# Patient Record
Sex: Female | Born: 1980 | Race: Black or African American | Hispanic: No | Marital: Married | Smoking: Never smoker
Health system: Southern US, Community
[De-identification: ages and names within clinical notes are randomized; demographics above are authoritative.]

## PROBLEM LIST (undated history)

## (undated) DIAGNOSIS — K219 Gastro-esophageal reflux disease without esophagitis: Secondary | ICD-10-CM

## (undated) HISTORY — PX: WISDOM TOOTH EXTRACTION: SHX21

---

## 2013-08-26 LAB — OB RESULTS CONSOLE HIV ANTIBODY (ROUTINE TESTING): HIV: NONREACTIVE

## 2013-08-26 LAB — OB RESULTS CONSOLE HEPATITIS B SURFACE ANTIGEN: Hepatitis B Surface Ag: NEGATIVE

## 2013-10-17 LAB — OB RESULTS CONSOLE GC/CHLAMYDIA
Chlamydia: NEGATIVE
GC PROBE AMP, GENITAL: NEGATIVE

## 2013-10-17 LAB — OB RESULTS CONSOLE RUBELLA ANTIBODY, IGM: Rubella: IMMUNE

## 2014-02-01 ENCOUNTER — Emergency Department (HOSPITAL_COMMUNITY)
Admission: EM | Admit: 2014-02-01 | Discharge: 2014-02-01 | Disposition: A | Discharge status: Home / Self Care | Payer: PRIVATE HEALTH INSURANCE | Attending: Emergency Medicine | Admitting: Emergency Medicine

## 2014-02-01 ENCOUNTER — Encounter (HOSPITAL_COMMUNITY): Payer: Self-pay

## 2014-02-01 ENCOUNTER — Emergency Department (HOSPITAL_COMMUNITY): Payer: PRIVATE HEALTH INSURANCE

## 2014-02-01 DIAGNOSIS — O9989 Other specified diseases and conditions complicating pregnancy, childbirth and the puerperium: Secondary | ICD-10-CM | POA: Insufficient documentation

## 2014-02-01 DIAGNOSIS — Z79899 Other long term (current) drug therapy: Secondary | ICD-10-CM | POA: Diagnosis not present

## 2014-02-01 DIAGNOSIS — R0602 Shortness of breath: Secondary | ICD-10-CM

## 2014-02-01 DIAGNOSIS — K219 Gastro-esophageal reflux disease without esophagitis: Secondary | ICD-10-CM | POA: Insufficient documentation

## 2014-02-01 DIAGNOSIS — Z3A29 29 weeks gestation of pregnancy: Secondary | ICD-10-CM | POA: Insufficient documentation

## 2014-02-01 DIAGNOSIS — R42 Dizziness and giddiness: Secondary | ICD-10-CM | POA: Insufficient documentation

## 2014-02-01 DIAGNOSIS — Z8719 Personal history of other diseases of the digestive system: Secondary | ICD-10-CM | POA: Insufficient documentation

## 2014-02-01 HISTORY — DX: Gastro-esophageal reflux disease without esophagitis: K21.9

## 2014-02-01 LAB — CBC WITH DIFFERENTIAL/PLATELET
Basophils Absolute: 0 10*3/uL (ref 0.0–0.1)
Basophils Relative: 0 % (ref 0–1)
EOS PCT: 1 % (ref 0–5)
Eosinophils Absolute: 0.1 10*3/uL (ref 0.0–0.7)
HCT: 31.4 % — ABNORMAL LOW (ref 36.0–46.0)
Hemoglobin: 11 g/dL — ABNORMAL LOW (ref 12.0–15.0)
LYMPHS PCT: 12 % (ref 12–46)
Lymphs Abs: 1.6 10*3/uL (ref 0.7–4.0)
MCH: 30.4 pg (ref 26.0–34.0)
MCHC: 35 g/dL (ref 30.0–36.0)
MCV: 86.7 fL (ref 78.0–100.0)
MONOS PCT: 6 % (ref 3–12)
Monocytes Absolute: 0.8 10*3/uL (ref 0.1–1.0)
Neutro Abs: 10.2 10*3/uL — ABNORMAL HIGH (ref 1.7–7.7)
Neutrophils Relative %: 81 % — ABNORMAL HIGH (ref 43–77)
Platelets: 234 10*3/uL (ref 150–400)
RBC: 3.62 MIL/uL — ABNORMAL LOW (ref 3.87–5.11)
RDW: 13 % (ref 11.5–15.5)
WBC: 12.6 10*3/uL — AB (ref 4.0–10.5)

## 2014-02-01 LAB — TROPONIN I

## 2014-02-01 LAB — COMPREHENSIVE METABOLIC PANEL
ALT: 14 U/L (ref 0–35)
AST: 18 U/L (ref 0–37)
Albumin: 2.8 g/dL — ABNORMAL LOW (ref 3.5–5.2)
Alkaline Phosphatase: 68 U/L (ref 39–117)
Anion gap: 13 (ref 5–15)
BUN: 13 mg/dL (ref 6–23)
CALCIUM: 9.2 mg/dL (ref 8.4–10.5)
CO2: 20 mEq/L (ref 19–32)
Chloride: 104 mEq/L (ref 96–112)
Creatinine, Ser: 0.68 mg/dL (ref 0.50–1.10)
GFR calc non Af Amer: >90 mL/min (ref >90)
Glucose, Bld: 100 mg/dL — ABNORMAL HIGH (ref 70–99)
Potassium: 3.7 mEq/L (ref 3.7–5.3)
Sodium: 137 mEq/L (ref 137–147)
Total Bilirubin: 0.2 mg/dL — ABNORMAL LOW (ref 0.3–1.2)
Total Protein: 6.7 g/dL (ref 6.0–8.3)

## 2014-02-01 LAB — PRO B NATRIURETIC PEPTIDE: Pro B Natriuretic peptide (BNP): 55.1 pg/mL (ref 0–125)

## 2014-02-01 NOTE — ED Provider Notes (Signed)
CSN: [id number]     Arrival date & time 02/01/14  1429 History   First MD Initiated Contact with Patient 02/01/14 1459     Chief Complaint  Patient presents with  . Shortness of Breath     (Consider location/radiation/quality/duration/timing/severity/associated sxs/prior Treatment) Patient is a 33 y.o. female presenting with shortness of breath.  Shortness of Breath Severity:  Severe Onset quality:  Gradual Duration:  1 week Timing:  Constant Progression:  Worsening (significantly worse today) Chronicity:  New Context comment:  [redacted] weeks pregnant.  G1P0 Relieved by:  Rest Worsened by:  Exertion and activity Associated symptoms: no chest pain, no cough, no fever and no syncope   Associated symptoms comment:  Lightheadedness   Past Medical History  Diagnosis Date  . Acid reflux    No past surgical history on file. No family history on file. History  Substance Use Topics  . Smoking status: Never Smoker   . Smokeless tobacco: Not on file  . Alcohol Use: No   OB History    Gravida Para Term Preterm AB TAB SAB Ectopic Multiple Living   1              Review of Systems  Constitutional: Negative for fever.  Respiratory: Positive for shortness of breath. Negative for cough.   Cardiovascular: Negative for chest pain and syncope.  All other systems reviewed and are negative.     Allergies  Prilosec  Home Medications   Prior to Admission medications   Medication Sig Start Date End Date Taking? Authorizing Provider  acetaminophen (TYLENOL) 500 MG tablet Take 500 mg by mouth every 6 (six) hours as needed for moderate pain or headache.   Yes Historical Provider, MD  Prenatal Vit-Fe Fumarate-FA (PRENATAL PO) Take 1 tablet by mouth daily.   Yes Historical Provider, MD   BP 103/66 mmHg  Pulse 85  Temp(Src) 98.2 F (36.8 C) (Oral)  Resp 16  SpO2 100%  LMP 07/08/2013 Physical Exam  Constitutional: She is oriented to person, place, and time. She appears well-developed  and well-nourished. No distress.  HENT:  Head: Normocephalic and atraumatic.  Mouth/Throat: Oropharynx is clear and moist.  Eyes: Conjunctivae are normal. Pupils are equal, round, and reactive to light. No scleral icterus.  Neck: Neck supple.  Cardiovascular: Normal rate, regular rhythm, normal heart sounds and intact distal pulses.   No murmur heard. Pulmonary/Chest: Effort normal and breath sounds normal. No stridor. No respiratory distress. She has no wheezes. She has no rales.  Abdominal: Soft. Bowel sounds are normal. She exhibits no distension. There is no tenderness. There is no rebound and no guarding.  gravid  Musculoskeletal: Normal range of motion.  Neurological: She is alert and oriented to person, place, and time.  Skin: Skin is warm and dry. No rash noted.  Psychiatric: She has a normal mood and affect. Her behavior is normal.  Nursing note and vitals reviewed.   ED Course  Procedures (including critical care time) Labs Review Labs Reviewed  CBC WITH DIFFERENTIAL - Abnormal; Notable for the following:    WBC 12.6 (*)    RBC 3.62 (*)    Hemoglobin 11.0 (*)    HCT 31.4 (*)    Neutrophils Relative % 81 (*)    Neutro Abs 10.2 (*)    All other components within normal limits  COMPREHENSIVE METABOLIC PANEL - Abnormal; Notable for the following:    Glucose, Bld 100 (*)    Albumin 2.8 (*)    Total Bilirubin  0.2 (*)    All other components within normal limits  TROPONIN I  PRO B NATRIURETIC PEPTIDE    Imaging Review Dg Chest 2 View  02/01/2014   CLINICAL DATA:  33 year old female with shortness of breath.  EXAM: CHEST  2 VIEW  COMPARISON:  None.  FINDINGS: The cardiomediastinal silhouette is unremarkable.  Mild peribronchial thickening identified.  There is no evidence of focal airspace disease, pulmonary edema, suspicious pulmonary nodule/mass, pleural effusion, or pneumothorax. No acute bony abnormalities are identified.  IMPRESSION: Mild peribronchial thickening of  uncertain chronicity, which may be a manifestation of bronchitis or reactive/asthma changes. No other significant abnormalities noted.   Electronically Signed   By: Laveda AbbeJeff  Hu M.D.   On: 02/01/2014 16:29  All radiology studies independently viewed by me.      EKG Interpretation   Date/Time:  Sunday February 01 2014 15:34:05 EST Ventricular Rate:  87 PR Interval:  126 QRS Duration: 73 QT Interval:  351 QTC Calculation: 422 R Axis:   69 Text Interpretation:  Sinus rhythm Borderline T abnormalities, anterior  leads No old tracing to compare Confirmed by [facility]OFF[facility]  MD, TREY ([id number]) on  02/01/2014 4:14:51 PM      MDM   Final diagnoses:  Shortness of breath    33 yo G1P0 presenting with shortness of breath.  She has been feeling some SOB for past week, but felt that it was pregnancy related.  Today, however, she has had two episodes when she became much more short of breath than expected with minimal exertion.  The second episode was after walking to her car.  She then drove for about 2 minutes before having to pull over secondary to SOB and dizziness.  Pregnancy has thus far been uncomplicated.    Labs unremarkable.  CXR normal.  She never became hypoxic.  She ambulated initially and was found to be tachycardic.  I then personally walked her briskly around the ED and immediately afterwards her HR was 90, her O2 sats were 100%, and she did not have any increased WOB.  I had a long discussion with her regarding the risks and benefits for CT imaging to rule out PE.  Her history was concerning for this, but her workup and exam were very reassuring.  Ultimately, she understood the risks of undiagnosed PE including morbidity and mortality.  She preferred to not obtain the CT scan.  She agreed to return if her SOB returned or if new concerning symptoms developed.  She will follow up closely with her OB office.  I spoke with her obstetrician on call, Dr. Chestine Sporelark, to review her history and ensure close follow  up.    Warnell Foresterrey Rollie Hynek, MD 02/01/14 2322

## 2014-02-01 NOTE — Discharge Instructions (Signed)

## 2014-02-01 NOTE — ED Notes (Signed)
Patient declined wheelchair

## 2014-02-01 NOTE — ED Notes (Signed)
She c/o shortness of breath x 2 days.  She states she is currently 27 wk. Gestation.  She is in no distress.  She states her shob became very pronounced while driving this afternoon "and it scared me".

## 2014-02-01 NOTE — ED Notes (Signed)
Patient transported to X-ray 

## 2014-03-10 LAB — OB RESULTS CONSOLE GBS: GBS: NEGATIVE

## 2014-04-13 ENCOUNTER — Inpatient Hospital Stay (HOSPITAL_COMMUNITY): Payer: PRIVATE HEALTH INSURANCE | Admitting: Anesthesiology

## 2014-04-13 ENCOUNTER — Encounter (HOSPITAL_COMMUNITY): Payer: Self-pay | Admitting: *Deleted

## 2014-04-13 ENCOUNTER — Inpatient Hospital Stay (HOSPITAL_COMMUNITY)
Admission: AD | Admit: 2014-04-13 | Discharge: 2014-04-16 | DRG: 766 | Disposition: A | Discharge status: Home / Self Care | Payer: PRIVATE HEALTH INSURANCE | Source: Ambulatory Visit | Attending: Obstetrics and Gynecology | Admitting: Obstetrics and Gynecology

## 2014-04-13 DIAGNOSIS — Z9889 Other specified postprocedural states: Secondary | ICD-10-CM

## 2014-04-13 DIAGNOSIS — Z3A39 39 weeks gestation of pregnancy: Secondary | ICD-10-CM | POA: Diagnosis present

## 2014-04-13 DIAGNOSIS — I959 Hypotension, unspecified: Secondary | ICD-10-CM | POA: Diagnosis present

## 2014-04-13 LAB — CBC
HCT: 36.8 % (ref 36.0–46.0)
Hemoglobin: 12.6 g/dL (ref 12.0–15.0)
MCH: 29.2 pg (ref 26.0–34.0)
MCHC: 34.2 g/dL (ref 30.0–36.0)
MCV: 85.4 fL (ref 78.0–100.0)
PLATELETS: 255 10*3/uL (ref 150–400)
RBC: 4.31 MIL/uL (ref 3.87–5.11)
RDW: 14.4 % (ref 11.5–15.5)
WBC: 19.1 10*3/uL — ABNORMAL HIGH (ref 4.0–10.5)

## 2014-04-13 LAB — TYPE AND SCREEN
ABO/RH(D): A POS
ANTIBODY SCREEN: NEGATIVE

## 2014-04-13 LAB — POCT FERN TEST: POCT Fern Test: POSITIVE

## 2014-04-13 LAB — OB RESULTS CONSOLE ABO/RH: RH Type: POSITIVE

## 2014-04-13 MED ORDER — OXYTOCIN BOLUS FROM INFUSION: 500.0000 mL | INTRAVENOUS | Status: DC

## 2014-04-13 MED ORDER — OXYTOCIN 40 UNITS IN LACTATED RINGERS INFUSION - SIMPLE MED
62.5000 mL/h | INTRAVENOUS | Status: DC
  Filled 2014-04-13: qty 1000

## 2014-04-13 MED ORDER — LIDOCAINE HCL (PF) 1 % IJ SOLN
30.0000 mL | INTRAMUSCULAR | Status: DC | PRN
  Filled 2014-04-13: qty 30

## 2014-04-13 MED ORDER — FENTANYL 2.5 MCG/ML BUPIVACAINE 1/10 % EPIDURAL INFUSION (WH - ANES)
14.0000 mL/h | INTRAMUSCULAR | Status: DC | PRN
  Administered 2014-04-13 – 2014-04-14 (×2): 14 mL/h via EPIDURAL
  Filled 2014-04-13 (×2): qty 125

## 2014-04-13 MED ORDER — PHENYLEPHRINE 40 MCG/ML (10ML) SYRINGE FOR IV PUSH (FOR BLOOD PRESSURE SUPPORT)
80.0000 ug | PREFILLED_SYRINGE | INTRAVENOUS | Status: DC | PRN
  Administered 2014-04-13: 80 ug via INTRAVENOUS
  Filled 2014-04-13: qty 20

## 2014-04-13 MED ORDER — LACTATED RINGERS IV SOLN
INTRAVENOUS | Status: DC
  Administered 2014-04-13 – 2014-04-14 (×3): via INTRAVENOUS

## 2014-04-13 MED ORDER — ONDANSETRON HCL 4 MG/2ML IJ SOLN
4.0000 mg | Freq: Four times a day (QID) | INTRAMUSCULAR | Status: DC | PRN
  Administered 2014-04-14: 4 mg via INTRAVENOUS
  Filled 2014-04-13: qty 2

## 2014-04-13 MED ORDER — FENTANYL 2.5 MCG/ML BUPIVACAINE 1/10 % EPIDURAL INFUSION (WH - ANES)
INTRAMUSCULAR | Status: DC | PRN
  Administered 2014-04-13: 14 mL/h via EPIDURAL

## 2014-04-13 MED ORDER — ACETAMINOPHEN 325 MG PO TABS: 650.0000 mg | ORAL_TABLET | ORAL | Status: DC | PRN

## 2014-04-13 MED ORDER — DIPHENHYDRAMINE HCL 50 MG/ML IJ SOLN: 12.5000 mg | INTRAMUSCULAR | Status: DC | PRN

## 2014-04-13 MED ORDER — CITRIC ACID-SODIUM CITRATE 334-500 MG/5ML PO SOLN
30.0000 mL | ORAL | Status: DC | PRN
  Administered 2014-04-14: 30 mL via ORAL
  Filled 2014-04-13: qty 15

## 2014-04-13 MED ORDER — LACTATED RINGERS IV SOLN
500.0000 mL | INTRAVENOUS | Status: DC | PRN
  Administered 2014-04-13 – 2014-04-14 (×3): 500 mL via INTRAVENOUS

## 2014-04-13 MED ORDER — LACTATED RINGERS IV SOLN
500.0000 mL | Freq: Once | INTRAVENOUS | Status: AC
  Administered 2014-04-13: 500 mL via INTRAVENOUS

## 2014-04-13 MED ORDER — BUTORPHANOL TARTRATE 1 MG/ML IJ SOLN
1.0000 mg | INTRAMUSCULAR | Status: DC | PRN
  Administered 2014-04-13 (×2): 1 mg via INTRAVENOUS
  Filled 2014-04-13 (×2): qty 1

## 2014-04-13 MED ORDER — EPHEDRINE 5 MG/ML INJ
10.0000 mg | INTRAVENOUS | Status: DC | PRN
  Administered 2014-04-14: 10 mg via INTRAVENOUS

## 2014-04-13 MED ORDER — EPHEDRINE 5 MG/ML INJ
10.0000 mg | INTRAVENOUS | Status: DC | PRN
  Filled 2014-04-13: qty 4

## 2014-04-13 MED ORDER — LIDOCAINE HCL (PF) 1 % IJ SOLN
INTRAMUSCULAR | Status: DC | PRN
  Administered 2014-04-13 (×2): 4 mL

## 2014-04-13 MED ORDER — PHENYLEPHRINE 40 MCG/ML (10ML) SYRINGE FOR IV PUSH (FOR BLOOD PRESSURE SUPPORT)
80.0000 ug | PREFILLED_SYRINGE | INTRAVENOUS | Status: DC | PRN
  Administered 2014-04-13: 80 ug via INTRAVENOUS

## 2014-04-13 NOTE — MAU Note (Signed)
Lost mucous plug on Sat, started having mild cramping.  Became more intense today, Since 1600 have been about 2-4 minutes apart. Was closed at last appt.  No bleeding or leaking.

## 2014-04-13 NOTE — Anesthesia Preprocedure Evaluation (Signed)
Anesthesia Evaluation  Patient identified by MRN, date of birth, ID band Patient awake    Reviewed: Allergy & Precautions, NPO status , Patient's Chart, lab work & pertinent test results  Airway Mallampati: II  TM Distance: >3 FB Neck ROM: Full    Dental no notable dental hx. (+) Teeth Intact   Pulmonary neg pulmonary ROS,  breath sounds clear to auscultation  Pulmonary exam normal       Cardiovascular negative cardio ROS  Rhythm:Regular Rate:Normal     Neuro/Psych negative neurological ROS  negative psych ROS   GI/Hepatic Neg liver ROS, GERD-  Medicated and Controlled,  Endo/Other  negative endocrine ROS  Renal/GU negative Renal ROS  negative genitourinary   Musculoskeletal negative musculoskeletal ROS (+)   Abdominal (+) - obese,   Peds  Hematology negative hematology ROS (+)   Anesthesia Other Findings   Reproductive/Obstetrics (+) Pregnancy                             Anesthesia Physical Anesthesia Plan  ASA: II  Anesthesia Plan: Epidural   Post-op Pain Management:    Induction:   Airway Management Planned: Natural Airway  Additional Equipment:   Intra-op Plan:   Post-operative Plan:   Informed Consent: I have reviewed the patients History and Physical, chart, labs and discussed the procedure including the risks, benefits and alternatives for the proposed anesthesia with the patient or authorized representative who has indicated his/her understanding and acceptance.     Plan Discussed with: Anesthesiologist  Anesthesia Plan Comments:         Anesthesia Quick Evaluation

## 2014-04-13 NOTE — Anesthesia Procedure Notes (Addendum)
Epidural Patient location during procedure: OB Start time: 04/13/2014 11:40 PM  Staffing Anesthesiologist: [place] Kirchner A. Performed by: anesthesiologist   Preanesthetic Checklist Completed: patient identified, site marked, surgical consent, pre-op evaluation, timeout performed, IV checked, risks and benefits discussed and monitors and equipment checked  Epidural Patient position: sitting Prep: site prepped and draped and DuraPrep Patient monitoring: continuous pulse ox and blood pressure Approach: midline Location: L3-L4 Injection technique: LOR air  Needle:  Needle type: Tuohy  Needle gauge: 17 G Needle length: 9 cm and 9 Needle insertion depth: 4 cm Catheter type: closed end flexible Catheter size: 19 Gauge Catheter at skin depth: 9 cm Test dose: negative and Other  Assessment Events: blood not aspirated, injection not painful, no injection resistance (.mf), negative IV test and no paresthesia  Additional Notes Patient identified. Risks and benefits discussed including failed block, incomplete  Pain control, post dural puncture headache, nerve damage, paralysis, blood pressure Changes, nausea, vomiting, reactions to medications-both toxic and allergic and post Partum back pain. All questions were answered. Patient expressed understanding and wished to proceed. Sterile technique was used throughout procedure. Epidural site was Dressed with sterile barrier dressing. No paresthesias, signs of intravascular injection Or signs of intrathecal spread were encountered.  Patient was more comfortable after the epidural was dosed. Please see RN's note for documentation of vital signs and FHR which are stable.

## 2014-04-13 NOTE — H&P (Signed)
34 y.o. G2P0010 @ 2068w6d presents with contractions q2-4 min since 1600.  She was found to be 1/80. While in MAU, she had rupture of membranes with a large gush of fluid, +nitrazine and + fern.  Otherwise has good fetal movement and no bleeding.  Past Medical History  Diagnosis Date  . Acid reflux     Past Surgical History  Procedure Laterality Date  . Wisdom tooth extraction      OB History  Gravida Para Term Preterm AB SAB TAB Ectopic Multiple Living  2    1 1         # Outcome Date GA Lbr Len/2nd Weight Sex Delivery Anes PTL Lv  2 Current           1 SAB               History   Social History  . Marital Status: Married    Spouse Name: N/A    Number of Children: N/A  . Years of Education: N/A   Occupational History  . Not on file.   Social History Main Topics  . Smoking status: Never Smoker   . Smokeless tobacco: Not on file  . Alcohol Use: No  . Drug Use: No  . Sexual Activity: Yes   Other Topics Concern  . Not on file   Social History Narrative   Prilosec    Prenatal Transfer Tool  Maternal Diabetes: No Genetic Screening: Normal FTS Maternal Ultrasounds/Referrals: Normal Fetal Ultrasounds or other Referrals:  None Maternal Substance Abuse:  No Significant Maternal Medications:  None Significant Maternal Lab Results: Lab values include: Group B Strep negative   Other PNC: uncomplicated.    Filed Vitals:   04/13/14 2019  BP: 117/74  Pulse: 75  Temp: 98.3 F (36.8 C)  Resp: 18     General:  NAD Lungs: CTAB Cardiac: RRR Abdomen:  soft, gravid, EFW 7# Ex:  tr edema SVE:  2-3/90/-1  FHTs:  150s, mod var, Cat 1 Toco:  q1-2 min   A/P   34 y.o. 2868w6d  G2P0010 presents with ROM IV pain meds/Epidural upon maternal request FSR/ vtx/ GBS neg  Kara Hays, Kara Hays

## 2014-04-13 NOTE — MAU Note (Signed)
Pt preparing to go walking, had large gush of clear fluid in the floor, fern positive.

## 2014-04-14 ENCOUNTER — Encounter (HOSPITAL_COMMUNITY)
Admission: AD | Disposition: A | Discharge status: Home / Self Care | Payer: Self-pay | Source: Ambulatory Visit | Attending: Obstetrics and Gynecology

## 2014-04-14 ENCOUNTER — Encounter (HOSPITAL_COMMUNITY): Payer: Self-pay | Admitting: *Deleted

## 2014-04-14 DIAGNOSIS — Z9889 Other specified postprocedural states: Secondary | ICD-10-CM

## 2014-04-14 LAB — ABO/RH: ABO/RH(D): A POS

## 2014-04-14 SURGERY — Surgical Case
Anesthesia: Epidural

## 2014-04-14 MED ORDER — METHYLERGONOVINE MALEATE 0.2 MG/ML IJ SOLN: 0.2000 mg | INTRAMUSCULAR | Status: DC | PRN

## 2014-04-14 MED ORDER — TERBUTALINE SULFATE 1 MG/ML IJ SOLN: 0.2500 mg | Freq: Once | INTRAMUSCULAR | Status: DC | PRN

## 2014-04-14 MED ORDER — MEPERIDINE HCL 25 MG/ML IJ SOLN: 6.2500 mg | INTRAMUSCULAR | Status: DC | PRN

## 2014-04-14 MED ORDER — FENTANYL CITRATE 0.05 MG/ML IJ SOLN
25.0000 ug | INTRAMUSCULAR | Status: DC | PRN
  Administered 2014-04-14: 25 ug via INTRAVENOUS

## 2014-04-14 MED ORDER — ACETAMINOPHEN 500 MG PO TABS
1000.0000 mg | ORAL_TABLET | Freq: Four times a day (QID) | ORAL | Status: AC
  Administered 2014-04-14 – 2014-04-15 (×3): 1000 mg via ORAL
  Filled 2014-04-14 (×3): qty 2

## 2014-04-14 MED ORDER — TETANUS-DIPHTH-ACELL PERTUSSIS 5-2.5-18.5 LF-MCG/0.5 IM SUSP: 0.5000 mL | Freq: Once | INTRAMUSCULAR | Status: DC

## 2014-04-14 MED ORDER — MEASLES, MUMPS & RUBELLA VAC ~~LOC~~ INJ
0.5000 mL | INJECTION | Freq: Once | SUBCUTANEOUS | Status: DC
  Filled 2014-04-14: qty 0.5

## 2014-04-14 MED ORDER — KETOROLAC TROMETHAMINE 30 MG/ML IJ SOLN
30.0000 mg | Freq: Four times a day (QID) | INTRAMUSCULAR | Status: AC | PRN
  Administered 2014-04-14: 30 mg via INTRAMUSCULAR

## 2014-04-14 MED ORDER — SCOPOLAMINE 1 MG/3DAYS TD PT72
MEDICATED_PATCH | TRANSDERMAL | Status: AC
  Filled 2014-04-14: qty 1

## 2014-04-14 MED ORDER — MENTHOL 3 MG MT LOZG: 1.0000 | LOZENGE | OROMUCOSAL | Status: DC | PRN

## 2014-04-14 MED ORDER — OXYTOCIN 40 UNITS IN LACTATED RINGERS INFUSION - SIMPLE MED
1.0000 m[IU]/min | INTRAVENOUS | Status: DC
  Administered 2014-04-14: 2 m[IU]/min via INTRAVENOUS
  Filled 2014-04-14: qty 1000

## 2014-04-14 MED ORDER — SIMETHICONE 80 MG PO CHEW
80.0000 mg | CHEWABLE_TABLET | ORAL | Status: DC
  Administered 2014-04-15 – 2014-04-16 (×2): 80 mg via ORAL
  Filled 2014-04-14 (×2): qty 1

## 2014-04-14 MED ORDER — DIBUCAINE 1 % RE OINT: 1.0000 "application " | TOPICAL_OINTMENT | RECTAL | Status: DC | PRN

## 2014-04-14 MED ORDER — OXYTOCIN 40 UNITS IN LACTATED RINGERS INFUSION - SIMPLE MED: 62.5000 mL/h | INTRAVENOUS | Status: AC

## 2014-04-14 MED ORDER — LANOLIN HYDROUS EX OINT: 1.0000 "application " | TOPICAL_OINTMENT | CUTANEOUS | Status: DC | PRN

## 2014-04-14 MED ORDER — METHYLERGONOVINE MALEATE 0.2 MG PO TABS: 0.2000 mg | ORAL_TABLET | ORAL | Status: DC | PRN

## 2014-04-14 MED ORDER — MORPHINE SULFATE (PF) 0.5 MG/ML IJ SOLN
INTRAMUSCULAR | Status: DC | PRN
  Administered 2014-04-14: 4 mg via EPIDURAL

## 2014-04-14 MED ORDER — FENTANYL CITRATE 0.05 MG/ML IJ SOLN
INTRAMUSCULAR | Status: AC
  Filled 2014-04-14: qty 2

## 2014-04-14 MED ORDER — FLEET ENEMA 7-19 GM/118ML RE ENEM: 1.0000 | ENEMA | Freq: Every day | RECTAL | Status: DC | PRN

## 2014-04-14 MED ORDER — MORPHINE SULFATE 0.5 MG/ML IJ SOLN
INTRAMUSCULAR | Status: AC
  Filled 2014-04-14: qty 10

## 2014-04-14 MED ORDER — TERBUTALINE SULFATE 1 MG/ML IJ SOLN
INTRAMUSCULAR | Status: AC
Start: 2014-04-14 — End: 2014-04-14
  Administered 2014-04-14: 0.25 mg via SUBCUTANEOUS
  Filled 2014-04-14: qty 1

## 2014-04-14 MED ORDER — PRENATAL MULTIVITAMIN CH
1.0000 | ORAL_TABLET | Freq: Every day | ORAL | Status: DC
  Administered 2014-04-15: 1 via ORAL

## 2014-04-14 MED ORDER — ONDANSETRON HCL 4 MG/2ML IJ SOLN: 4.0000 mg | Freq: Three times a day (TID) | INTRAMUSCULAR | Status: DC | PRN

## 2014-04-14 MED ORDER — DIPHENHYDRAMINE HCL 50 MG/ML IJ SOLN: 12.5000 mg | INTRAMUSCULAR | Status: DC | PRN

## 2014-04-14 MED ORDER — NALBUPHINE HCL 10 MG/ML IJ SOLN: 5.0000 mg | Freq: Once | INTRAMUSCULAR | Status: AC | PRN

## 2014-04-14 MED ORDER — LIDOCAINE-EPINEPHRINE (PF) 2 %-1:200000 IJ SOLN
INTRAMUSCULAR | Status: AC
  Filled 2014-04-14: qty 20

## 2014-04-14 MED ORDER — SCOPOLAMINE 1 MG/3DAYS TD PT72
1.0000 | MEDICATED_PATCH | Freq: Once | TRANSDERMAL | Status: DC
  Administered 2014-04-14: 1.5 mg via TRANSDERMAL

## 2014-04-14 MED ORDER — DIPHENHYDRAMINE HCL 25 MG PO CAPS
25.0000 mg | ORAL_CAPSULE | ORAL | Status: DC | PRN
  Filled 2014-04-14: qty 1

## 2014-04-14 MED ORDER — SIMETHICONE 80 MG PO CHEW
80.0000 mg | CHEWABLE_TABLET | Freq: Three times a day (TID) | ORAL | Status: DC
  Administered 2014-04-15 (×3): 80 mg via ORAL
  Filled 2014-04-14 (×2): qty 1

## 2014-04-14 MED ORDER — MEPERIDINE HCL 25 MG/ML IJ SOLN
INTRAMUSCULAR | Status: AC
  Filled 2014-04-14: qty 1

## 2014-04-14 MED ORDER — ONDANSETRON HCL 4 MG/2ML IJ SOLN
4.0000 mg | INTRAMUSCULAR | Status: DC | PRN
  Administered 2014-04-14: 4 mg via INTRAVENOUS
  Filled 2014-04-14: qty 2

## 2014-04-14 MED ORDER — SIMETHICONE 80 MG PO CHEW: 80.0000 mg | CHEWABLE_TABLET | ORAL | Status: DC | PRN

## 2014-04-14 MED ORDER — NALOXONE HCL 1 MG/ML IJ SOLN
1.0000 ug/kg/h | INTRAVENOUS | Status: DC | PRN
  Filled 2014-04-14: qty 2

## 2014-04-14 MED ORDER — VITAMIN K1 1 MG/0.5ML IJ SOLN
INTRAMUSCULAR | Status: AC
  Filled 2014-04-14: qty 0.5

## 2014-04-14 MED ORDER — OXYTOCIN 10 UNIT/ML IJ SOLN
INTRAMUSCULAR | Status: AC
Start: 2014-04-14 — End: 2014-04-14
  Filled 2014-04-14: qty 4

## 2014-04-14 MED ORDER — ERYTHROMYCIN 5 MG/GM OP OINT
TOPICAL_OINTMENT | OPHTHALMIC | Status: AC
  Filled 2014-04-14: qty 1

## 2014-04-14 MED ORDER — SALINE SPRAY 0.65 % NA SOLN
1.0000 | NASAL | Status: DC | PRN
  Filled 2014-04-14: qty 44

## 2014-04-14 MED ORDER — KETOROLAC TROMETHAMINE 30 MG/ML IJ SOLN: 30.0000 mg | Freq: Four times a day (QID) | INTRAMUSCULAR | Status: AC | PRN

## 2014-04-14 MED ORDER — OXYTOCIN 10 UNIT/ML IJ SOLN
40.0000 [IU] | INTRAMUSCULAR | Status: DC | PRN
  Administered 2014-04-14: 40 [IU] via INTRAVENOUS

## 2014-04-14 MED ORDER — NALOXONE HCL 0.4 MG/ML IJ SOLN: 0.4000 mg | INTRAMUSCULAR | Status: DC | PRN

## 2014-04-14 MED ORDER — TERBUTALINE SULFATE 1 MG/ML IJ SOLN
0.2500 mg | Freq: Once | INTRAMUSCULAR | Status: AC
  Administered 2014-04-14: 0.25 mg via SUBCUTANEOUS

## 2014-04-14 MED ORDER — CEFAZOLIN SODIUM-DEXTROSE 2-3 GM-% IV SOLR
INTRAVENOUS | Status: AC
  Filled 2014-04-14: qty 50

## 2014-04-14 MED ORDER — DIPHENHYDRAMINE HCL 25 MG PO CAPS: 25.0000 mg | ORAL_CAPSULE | Freq: Four times a day (QID) | ORAL | Status: DC | PRN

## 2014-04-14 MED ORDER — KETOROLAC TROMETHAMINE 30 MG/ML IJ SOLN
INTRAMUSCULAR | Status: AC
  Filled 2014-04-14: qty 1

## 2014-04-14 MED ORDER — ONDANSETRON HCL 4 MG PO TABS: 4.0000 mg | ORAL_TABLET | ORAL | Status: DC | PRN

## 2014-04-14 MED ORDER — OXYCODONE-ACETAMINOPHEN 5-325 MG PO TABS
1.0000 | ORAL_TABLET | ORAL | Status: DC | PRN
  Administered 2014-04-16 (×2): 1 via ORAL
  Filled 2014-04-14 (×2): qty 1

## 2014-04-14 MED ORDER — ONDANSETRON HCL 4 MG/2ML IJ SOLN
INTRAMUSCULAR | Status: DC | PRN
  Administered 2014-04-14: 4 mg via INTRAVENOUS

## 2014-04-14 MED ORDER — SODIUM BICARBONATE 8.4 % IV SOLN
INTRAVENOUS | Status: DC | PRN
  Administered 2014-04-14 (×2): 5 mL via EPIDURAL

## 2014-04-14 MED ORDER — WITCH HAZEL-GLYCERIN EX PADS: 1.0000 "application " | MEDICATED_PAD | CUTANEOUS | Status: DC | PRN

## 2014-04-14 MED ORDER — LACTATED RINGERS IV SOLN: INTRAVENOUS | Status: DC

## 2014-04-14 MED ORDER — NALBUPHINE HCL 10 MG/ML IJ SOLN: 5.0000 mg | INTRAMUSCULAR | Status: DC | PRN

## 2014-04-14 MED ORDER — CEFAZOLIN SODIUM-DEXTROSE 2-3 GM-% IV SOLR
INTRAVENOUS | Status: DC | PRN
  Administered 2014-04-14: 2 g via INTRAVENOUS

## 2014-04-14 MED ORDER — BISACODYL 10 MG RE SUPP: 10.0000 mg | Freq: Every day | RECTAL | Status: DC | PRN

## 2014-04-14 MED ORDER — SENNOSIDES-DOCUSATE SODIUM 8.6-50 MG PO TABS
2.0000 | ORAL_TABLET | ORAL | Status: DC
  Administered 2014-04-15 – 2014-04-16 (×2): 2 via ORAL
  Filled 2014-04-14 (×2): qty 2

## 2014-04-14 MED ORDER — FERROUS SULFATE 325 (65 FE) MG PO TABS
325.0000 mg | ORAL_TABLET | Freq: Two times a day (BID) | ORAL | Status: DC
  Administered 2014-04-15 (×2): 325 mg via ORAL
  Filled 2014-04-14 (×2): qty 1

## 2014-04-14 MED ORDER — ZOLPIDEM TARTRATE 5 MG PO TABS: 5.0000 mg | ORAL_TABLET | Freq: Every evening | ORAL | Status: DC | PRN

## 2014-04-14 MED ORDER — ONDANSETRON HCL 4 MG/2ML IJ SOLN
INTRAMUSCULAR | Status: AC
  Filled 2014-04-14: qty 2

## 2014-04-14 MED ORDER — IBUPROFEN 600 MG PO TABS: 600.0000 mg | ORAL_TABLET | Freq: Four times a day (QID) | ORAL | Status: DC | PRN

## 2014-04-14 MED ORDER — SODIUM BICARBONATE 8.4 % IV SOLN
INTRAVENOUS | Status: AC
  Filled 2014-04-14: qty 50

## 2014-04-14 MED ORDER — SODIUM CHLORIDE 0.9 % IJ SOLN: 3.0000 mL | INTRAMUSCULAR | Status: DC | PRN

## 2014-04-14 MED ORDER — IBUPROFEN 600 MG PO TABS
600.0000 mg | ORAL_TABLET | Freq: Four times a day (QID) | ORAL | Status: DC
  Administered 2014-04-14 – 2014-04-16 (×5): 600 mg via ORAL
  Filled 2014-04-14 (×6): qty 1

## 2014-04-14 MED ORDER — ONDANSETRON HCL 4 MG/2ML IJ SOLN: 4.0000 mg | Freq: Once | INTRAMUSCULAR | Status: DC | PRN

## 2014-04-14 MED ORDER — OXYCODONE-ACETAMINOPHEN 5-325 MG PO TABS: 2.0000 | ORAL_TABLET | ORAL | Status: DC | PRN

## 2014-04-14 SURGICAL SUPPLY — 31 items
BENZOIN TINCTURE PRP APPL 2/3 (GAUZE/BANDAGES/DRESSINGS) ×3 IMPLANT
CLAMP CORD UMBIL (MISCELLANEOUS) IMPLANT
CLOSURE WOUND 1/2 X4 (GAUZE/BANDAGES/DRESSINGS) ×1
CLOTH BEACON ORANGE TIMEOUT ST (SAFETY) ×3 IMPLANT
DRAPE SHEET LG 3/4 BI-LAMINATE (DRAPES) IMPLANT
DRSG OPSITE POSTOP 4X10 (GAUZE/BANDAGES/DRESSINGS) ×3 IMPLANT
DURAPREP 26ML APPLICATOR (WOUND CARE) ×3 IMPLANT
ELECT REM PT RETURN 9FT ADLT (ELECTROSURGICAL) ×3
ELECTRODE REM PT RTRN 9FT ADLT (ELECTROSURGICAL) ×1 IMPLANT
EXTRACTOR VACUUM BELL STYLE (SUCTIONS) IMPLANT
GLOVE BIO SURGEON STRL SZ7 (GLOVE) ×3 IMPLANT
GOWN STRL REUS W/TWL LRG LVL3 (GOWN DISPOSABLE) ×6 IMPLANT
KIT ABG SYR 3ML LUER SLIP (SYRINGE) IMPLANT
NEEDLE HYPO 25X5/8 SAFETYGLIDE (NEEDLE) IMPLANT
NS IRRIG 1000ML POUR BTL (IV SOLUTION) ×3 IMPLANT
PACK C SECTION WH (CUSTOM PROCEDURE TRAY) ×3 IMPLANT
PAD OB MATERNITY 4.3X12.25 (PERSONAL CARE ITEMS) ×3 IMPLANT
RTRCTR C-SECT PINK 25CM LRG (MISCELLANEOUS) ×3 IMPLANT
STAPLER VISISTAT 35W (STAPLE) IMPLANT
STRIP CLOSURE SKIN 1/2X4 (GAUZE/BANDAGES/DRESSINGS) ×2 IMPLANT
SUT MNCRL 0 VIOLET CTX 36 (SUTURE) ×2 IMPLANT
SUT MONOCRYL 0 CTX 36 (SUTURE) ×4
SUT PDS AB 0 CTX 60 (SUTURE) IMPLANT
SUT PLAIN 2 0 XLH (SUTURE) IMPLANT
SUT VIC AB 0 CT1 27 (SUTURE) ×4
SUT VIC AB 0 CT1 27XBRD ANBCTR (SUTURE) ×2 IMPLANT
SUT VIC AB 2-0 CT1 27 (SUTURE) ×2
SUT VIC AB 2-0 CT1 TAPERPNT 27 (SUTURE) ×1 IMPLANT
SUT VIC AB 4-0 KS 27 (SUTURE) ×3 IMPLANT
TOWEL OR 17X24 6PK STRL BLUE (TOWEL DISPOSABLE) ×3 IMPLANT
TRAY FOLEY CATH 14FR (SET/KITS/TRAYS/PACK) ×3 IMPLANT

## 2014-04-14 NOTE — Progress Notes (Signed)
FHTs 150s with decels to 120s and severe variable decels with every 3rd contraction.  No change in cervix.  FHTs are persistent cat 2 and occ cat 3- for LTCS for non reassuring FHTs remote from vaginal delivery.  Pt has been 5-6 cm since 0330 this am without change, even on pitocin in last hour.  All R/B/Alt d/w pt and she agrees to proceed.

## 2014-04-14 NOTE — Transfer of Care (Signed)
Immediate Anesthesia Transfer of Care Note  Patient: Kara Hays  Procedure(s) Performed: Procedure(s): CESAREAN SECTION (N/A)  Patient Location: PACU  Anesthesia Type:Epidural  Level of Consciousness: awake  Airway & Oxygen Therapy: Patient Spontanous Breathing  Post-op Assessment: Report given to PACU RN  Post vital signs: Reviewed and stable  Complications: No apparent anesthesia complications

## 2014-04-14 NOTE — Consult Note (Signed)
Neonatology Note:   Attendance at C-section:    I was asked by Dr. Henderson CloudHorvath to attend this primary C/S at term [facility]. The mother is a G2P0A1 A pos, GBS neg with an uncomplicated pregnancy. ROM 16 hours prior to delivery, fluid clear. CAN times 1 loosely, OP presentation. Infant had good HR and color, and cried with stimulation; muscle tone slightly decreased. Needed  bulb suctioning for clear secretions. Tone improved gradually and was normal by 5-6 minutes. Moving both arms well, no focal deficits noted. Ap 8/9. Lungs clear to ausc in DR. Bruising over the nose, philtrum, and chin noted in DR. To CN to care of Pediatrician.   Doretha Souhristie C. Estephania Licciardi, MD

## 2014-04-14 NOTE — Lactation Note (Signed)
This note was copied from the chart of Kara Hays. Lactation Consultation Note  Patient Name: Kara [place] Paff Today's Date: 04/14/2014 Reason for consult: Initial assessment Mom reports baby has been to the breast 2 times since delivery. Mom concerned if baby is getting anything. Basic teaching reviewed, tummy sizes discussed. Hand expression demonstrated, few drops colostrum present. Encouraged Mom to BF with feeding ques. Lactation brochure left for review, advised of OP services and support group. Encouraged to call for assist as needed.   Maternal Data Has patient been taught Hand Expression?: Yes Does the patient have breastfeeding experience prior to this delivery?: No  Feeding Feeding Type: Breast Fed Length of feed: 20 min  LATCH Score/Interventions Latch: Too sleepy or reluctant, no latch achieved, no sucking elicited.  Audible Swallowing: None Intervention(s): Skin to skin  Type of Nipple: Everted at rest and after stimulation  Comfort (Breast/Nipple): Soft / non-tender     Hold (Positioning): Full assist, staff holds infant at breast  LATCH Score: 4  Lactation Tools Discussed/Used     Consult Status Consult Status: Follow-up Date: 04/15/14 Follow-up type: In-patient    Alfred LevinsGranger, Archit Leger Ann 04/14/2014, 3:21 PM

## 2014-04-14 NOTE — Progress Notes (Addendum)
Pt stable with epidural.   FHTs 130s, gSTV and NST R- occ mild to moderate variables with contractions; one severe variable about 30 nminutes ago with good recovery.  Cat 2- reassess in 30 minutes.  Toco q 3  SVE 8/90/0-per Dr. Chestine Sporelark.  Continue labor for now with good progress.

## 2014-04-14 NOTE — Progress Notes (Addendum)
Alerted of fetal decelerations following epidural.  Patient became relatively hypotensive with BPs in the 90s/50s.  She received phenylephrine x 2.  Prolonged decel with nadir in the 70s, intermittent return to baseline, but total time of deceleration ~15 min. Contractions were occuring q1 minute.  SVE per RN was 4cm.  I was alerted of the decel at 2359, at which time I ordered terbutaline to be administered.    0.25mg  of terb given at 0005 with immediate spacing of contraction frequency and recovery of fetal baseline to the 150s.  Variability was moderate and fetal status was reassuring.   At 0510, I was called to evaluate fetal position due to nursing concern for a non-vertex presentation.  On my exam, SVE was 7/90/-1.  Caput was starting to develop.  Fetal position was felt to be OA, but asynclitic.   EFM with occasional short sharp variable decel, but variability was moderate.  Anticipate SVD

## 2014-04-14 NOTE — Progress Notes (Signed)
Called by L&D nurse- pt checked by her at 0900 and she felt pt was really 5/90/0.  I have just checked pt and I agree pt is more 5-6 than 7-8; 5.5/90/-1  FHTs 150s, g STV and NST R; now moderate variables are more in late presentation and still occ severe variables.    Pt not put on pitocin yet- will start pitocin to advance labor.  If baby intolerant, will proceed with c/s.

## 2014-04-14 NOTE — Op Note (Signed)
04/13/2014 - 04/14/2014  12:01 PM  PATIENT:  Kara Hays  33 y.o. female  PRE-OPERATIVE DIAGNOSIS:  primary cesarean section for nonreassuring fhts remote from vaginal delivery.  POST-OPERATIVE DIAGNOSIS:  same PROCEDURE:  Procedure(s): CESAREAN SECTION (N/A)  SURGEON:  Surgeon(s) and Role:    * Stevi Hollinshead A Novia Lansberry, MD - Primary   ANESTHESIA:   epidural  EBL:  Total I/O In: -  Out: 1100 [Urine:300; Blood:800]  SPECIMEN:  No Specimen  DISPOSITION OF SPECIMEN:  N/A  COUNTS:  YES  TOURNIQUET:  * No tourniquets in log *  DICTATION: .Note written in EPIC  PLAN OF CARE: Admit to inpatient   PATIENT DISPOSITION:  PACU - hemodynamically stable.   Delay start of Pharmacological VTE agent (>24hrs) due to surgical blood loss or risk of bleeding: not applicable  Complications:  none Medications:  Ancef, Pitocin Findings:  Baby female, Apgars 8,9, weight 8#2.   Normal tubes, ovaries and uterus seen.  Baby was skin to skin with mother after birth in the OR.  Technique:  After adequate epidural anesthesia was achieved, the patient was prepped and draped in usual sterile fashion.  A foley catheter was used to drain the bladder.  A pfannanstiel incision was made with the scalpel and carried down to the fascia with the bovie cautery. The fascia was incised in the midline with the scalpel and carried in a transverse curvilinear manner bilaterally.  The fascia was reflected superiorly and inferiorly off the rectus muscles and the muscles split in the midline.  A bowel free portion of the peritoneum was entered bluntly and then extended in a superior and inferior manner with good visualization of the bowel and bladder.  The Alexis instrument was then placed and the vesico-uterine fascia tented up and incised in a transverse curvilinear manner.  A 2 cm transverse incision was made in the upper portion of the lower uterine segment until the amnion was exposed.   The incision was extended transversely  in a blunt manner.  Clear fluid was noted and the baby delivered in the vertex presentation without complication.  The baby was bulb suctioned and the cord was clamped and cut.  The baby was then handed to awaiting Neonatology.  The placenta was then delivered manually and the uterus cleared of all debris.  The uterine incision was then closed with a running lock stitch of 0 monocryl.  An imbricating layer of 0 monocryl was closed as well. Excellent hemostasis of the uterine incision was achieved and the abdomen was cleared with irrigation.  The peritoneum was closed with a running stitch of 2-0 vicryl.  This incorporated the rectus muscles as a separate layer.  The fascia was then closed with a running stitch of 0 vicryl.  The subcutaneous layer was closed with interrupted  stitches of 2-0 plain gut.  The skin was closed with 4-0 vicryl on a Keith needle and steri-strips.  The patient tolerated the procedure well and was returned to the recovery room in stable condition.  All counts were correct times three.  Fara Worthy A   

## 2014-04-14 NOTE — Brief Op Note (Signed)
04/13/2014 - 04/14/2014  12:01 PM  PATIENT:  Kara Hays  34 y.o. female  PRE-OPERATIVE DIAGNOSIS:  primary cesarean section for nonreassuring fhts remote from vaginal delivery.  POST-OPERATIVE DIAGNOSIS:  same PROCEDURE:  Procedure(s): CESAREAN SECTION (N/Hays)  SURGEON:  Surgeon(s) and Role:    * Loney LaurenceMichelle Hays Luchiano Viscomi, MD - Primary   ANESTHESIA:   epidural  EBL:  Total I/O In: -  Out: 1100 [Urine:300; Blood:800]  SPECIMEN:  No Specimen  DISPOSITION OF SPECIMEN:  N/Hays  COUNTS:  YES  TOURNIQUET:  * No tourniquets in log *  DICTATION: .Note written in EPIC  PLAN OF CARE: Admit to inpatient   PATIENT DISPOSITION:  PACU - hemodynamically stable.   Delay start of Pharmacological VTE agent (>24hrs) due to surgical blood loss or risk of bleeding: not applicable  Complications:  none Medications:  Ancef, Pitocin Findings:  Baby female, Apgars 8,9, weight 8#2.   Normal tubes, ovaries and uterus seen.  Baby was skin to skin with mother after birth in the OR.  Technique:  After adequate epidural anesthesia was achieved, the patient was prepped and draped in usual sterile fashion.  Hays foley catheter was used to drain the bladder.  Hays pfannanstiel incision was made with the scalpel and carried down to the fascia with the bovie cautery. The fascia was incised in the midline with the scalpel and carried in Hays transverse curvilinear manner bilaterally.  The fascia was reflected superiorly and inferiorly off the rectus muscles and the muscles split in the midline.  Hays bowel free portion of the peritoneum was entered bluntly and then extended in Hays superior and inferior manner with good visualization of the bowel and bladder.  The Alexis instrument was then placed and the vesico-uterine fascia tented up and incised in Hays transverse curvilinear manner.  Hays 2 cm transverse incision was made in the upper portion of the lower uterine segment until the amnion was exposed.   The incision was extended transversely  in Hays blunt manner.  Clear fluid was noted and the baby delivered in the vertex presentation without complication.  The baby was bulb suctioned and the cord was clamped and cut.  The baby was then handed to awaiting Neonatology.  The placenta was then delivered manually and the uterus cleared of all debris.  The uterine incision was then closed with Hays running lock stitch of 0 monocryl.  An imbricating layer of 0 monocryl was closed as well. Excellent hemostasis of the uterine incision was achieved and the abdomen was cleared with irrigation.  The peritoneum was closed with Hays running stitch of 2-0 vicryl.  This incorporated the rectus muscles as Hays separate layer.  The fascia was then closed with Hays running stitch of 0 vicryl.  The subcutaneous layer was closed with interrupted  stitches of 2-0 plain gut.  The skin was closed with 4-0 vicryl on Hays Keith needle and steri-strips.  The patient tolerated the procedure well and was returned to the recovery room in stable condition.  All counts were correct times three.  Kara Hays

## 2014-04-15 ENCOUNTER — Encounter (HOSPITAL_COMMUNITY): Payer: Self-pay | Admitting: *Deleted

## 2014-04-15 LAB — CBC
HCT: 28.9 % — ABNORMAL LOW (ref 36.0–46.0)
Hemoglobin: 9.9 g/dL — ABNORMAL LOW (ref 12.0–15.0)
MCH: 29.3 pg (ref 26.0–34.0)
MCHC: 34.3 g/dL (ref 30.0–36.0)
MCV: 85.5 fL (ref 78.0–100.0)
PLATELETS: 191 10*3/uL (ref 150–400)
RBC: 3.38 MIL/uL — ABNORMAL LOW (ref 3.87–5.11)
RDW: 14.7 % (ref 11.5–15.5)
WBC: 19.3 10*3/uL — ABNORMAL HIGH (ref 4.0–10.5)

## 2014-04-15 LAB — RPR: RPR Ser Ql: NONREACTIVE

## 2014-04-15 LAB — BIRTH TISSUE RECOVERY COLLECTION (PLACENTA DONATION)

## 2014-04-15 NOTE — Anesthesia Postprocedure Evaluation (Signed)
  Anesthesia Post-op Note  Patient: Kara Hays  Procedure(s) Performed: Procedure(s): CESAREAN SECTION (N/A)  Patient Location: Mother/Baby  Anesthesia Type:Epidural  Level of Consciousness: awake, alert  and oriented  Airway and Oxygen Therapy: Patient Spontanous Breathing  Post-op Pain: none  Post-op Assessment: Post-op Vital signs reviewed, Patient's Cardiovascular Status Stable, Respiratory Function Stable, No headache, No backache, No residual numbness and No residual motor weakness  Post-op Vital Signs: Reviewed and stable  Last Vitals:  Filed Vitals:   04/15/14 0500  BP: 97/53  Pulse: 74  Temp: 36.9 C  Resp: 20    Complications: No apparent anesthesia complications

## 2014-04-15 NOTE — Progress Notes (Signed)
Patient is eating, ambulating, voiding.  Pain control is good.  Appropriate lochia.  No complaints. Circ desired. No flatus.  Filed Vitals:   04/14/14 2105 04/14/14 2110 04/15/14 0034 04/15/14 0500  BP: 89/56 94/56 97/53  97/53  Pulse: 78 100 74 74  Temp:   98.4 F (36.9 C) 98.4 F (36.9 C)  TempSrc:      Resp:   20 20  Height:      Weight:      SpO2:   99% 98%    Fundus firm Abd: soft, surgically tender- appropriate Inc: c/d/i Ext: no CT  Lab Results  Component Value Date   WBC 19.3* 04/15/2014   HGB 9.9* 04/15/2014   HCT 28.9* 04/15/2014   MCV 85.5 04/15/2014   PLT 191 04/15/2014    --/--/A POS, A POS (01/25 2055)/  A/P Post op day #1 s/p c/s for NRFHT Circ done Encourage ambulation  Routine care.  Expect d/c 1/28.    Kara Hays, Thane Age

## 2014-04-16 MED ORDER — OXYCODONE-ACETAMINOPHEN 5-325 MG PO TABS: 1.0000 | ORAL_TABLET | ORAL | Status: AC | PRN

## 2014-04-16 NOTE — Progress Notes (Signed)
POD#2 Pt without complaints. She would like to go home. Baby is doing well. IMP/ Stable Plan/ Will discharge to home.

## 2014-04-16 NOTE — Anesthesia Postprocedure Evaluation (Signed)
  Anesthesia Post-op Note  Patient: Kara Hays  Procedure(s) Performed: Procedure(s) (LRB): CESAREAN SECTION (N/A)  Patient Location: PACU  Anesthesia Type: Epidural  Level of Consciousness: awake and alert   Airway and Oxygen Therapy: Patient Spontanous Breathing  Post-op Pain: mild  Post-op Assessment: Post-op Vital signs reviewed, Patient's Cardiovascular Status Stable, Respiratory Function Stable, Patent Airway and No signs of Nausea or vomiting  Last Vitals:  Filed Vitals:   04/16/14 0554  BP: 105/62  Pulse: 84  Temp: 36.6 C  Resp:     Post-op Vital Signs: stable   Complications: No apparent anesthesia complications

## 2014-04-16 NOTE — Discharge Summary (Signed)
Obstetric Discharge Summary Reason for Admission: onset of labor and rupture of membranes Prenatal Procedures: ultrasound Intrapartum Procedures: cesarean: low cervical, transverse Postpartum Procedures: none Complications-Operative and Postpartum: none HEMOGLOBIN  Date Value Ref Range Status  04/15/2014 9.9* 12.0 - 15.0 g/dL Final    Comment:    DELTA CHECK NOTED REPEATED TO VERIFY    HCT  Date Value Ref Range Status  04/15/2014 28.9* 36.0 - 46.0 % Final    Physical Exam:  General: alert Lochia: appropriate Uterine Fundus: firm Incision: healing well DVT Evaluation: No evidence of DVT seen on physical exam.  Discharge Diagnoses: Term Pregnancy-delivered and Non reassuring heart rate  Discharge Information: Date: 04/16/2014 Activity: pelvic rest Diet: routine Medications: PNV, Ibuprofen and Percocet Condition: stable Instructions: refer to practice specific booklet Discharge to: home Follow-up Information    Follow up with HORVATH,MICHELLE A, MD. Schedule an appointment as soon as possible for a visit in 1 month.   Specialty:  Obstetrics and Gynecology   Contact information:   [address] [place] [id number] [phone]       Newborn Data: Live born female  Birth Weight: 8 lb 2.2 oz (3690 g) APGAR: 8, 9  Home with mother.  ANDERSON,MARK E 04/16/2014, 8:23 AM

## 2015-09-08 IMAGING — CR DG CHEST 2V
2 series · 2 of 2 positions shown · non-contrast
Comparison: None.

CLINICAL DATA: 33-year-old female with shortness of breath.

EXAM:
CHEST  2 VIEW

[w chest pa]
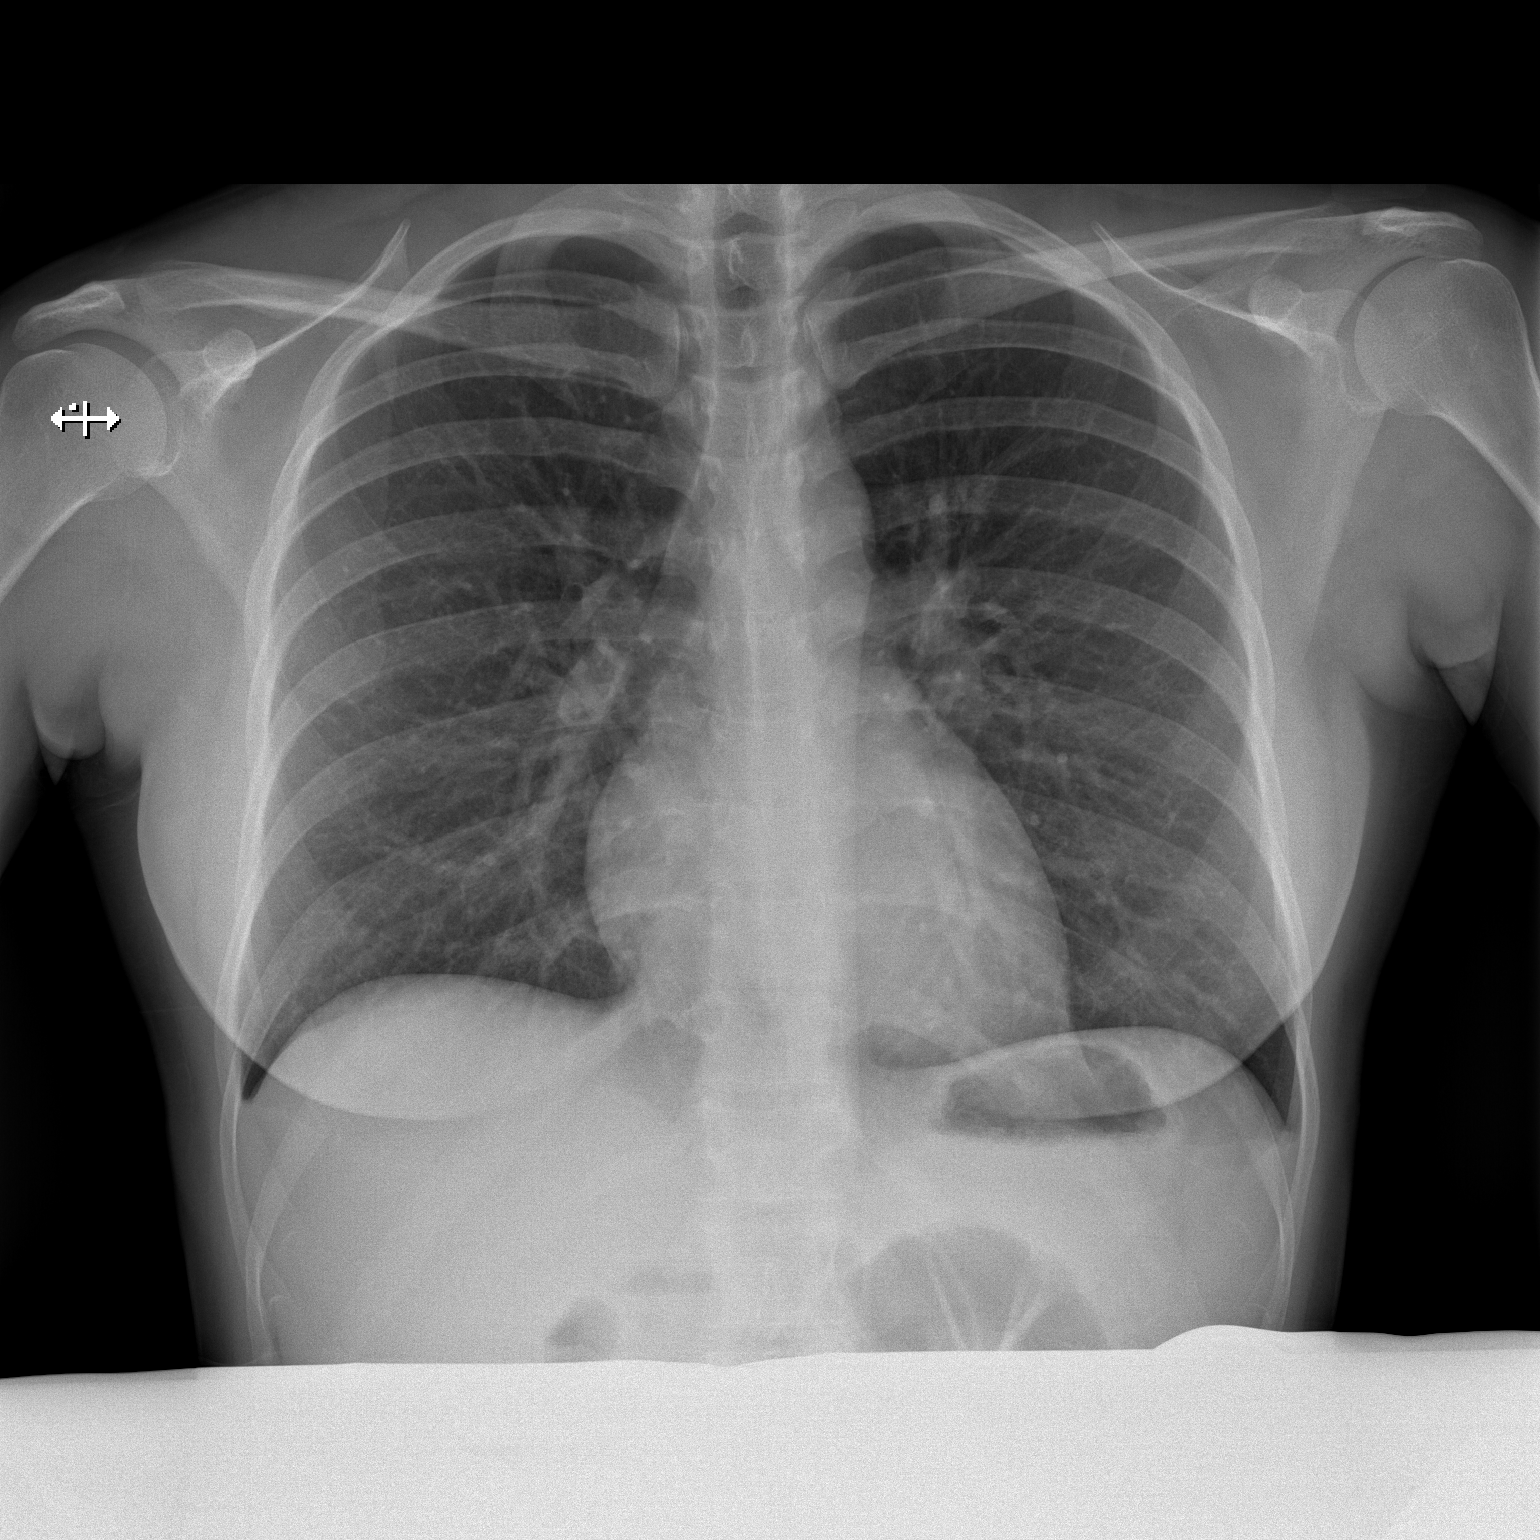

[w chest lat]
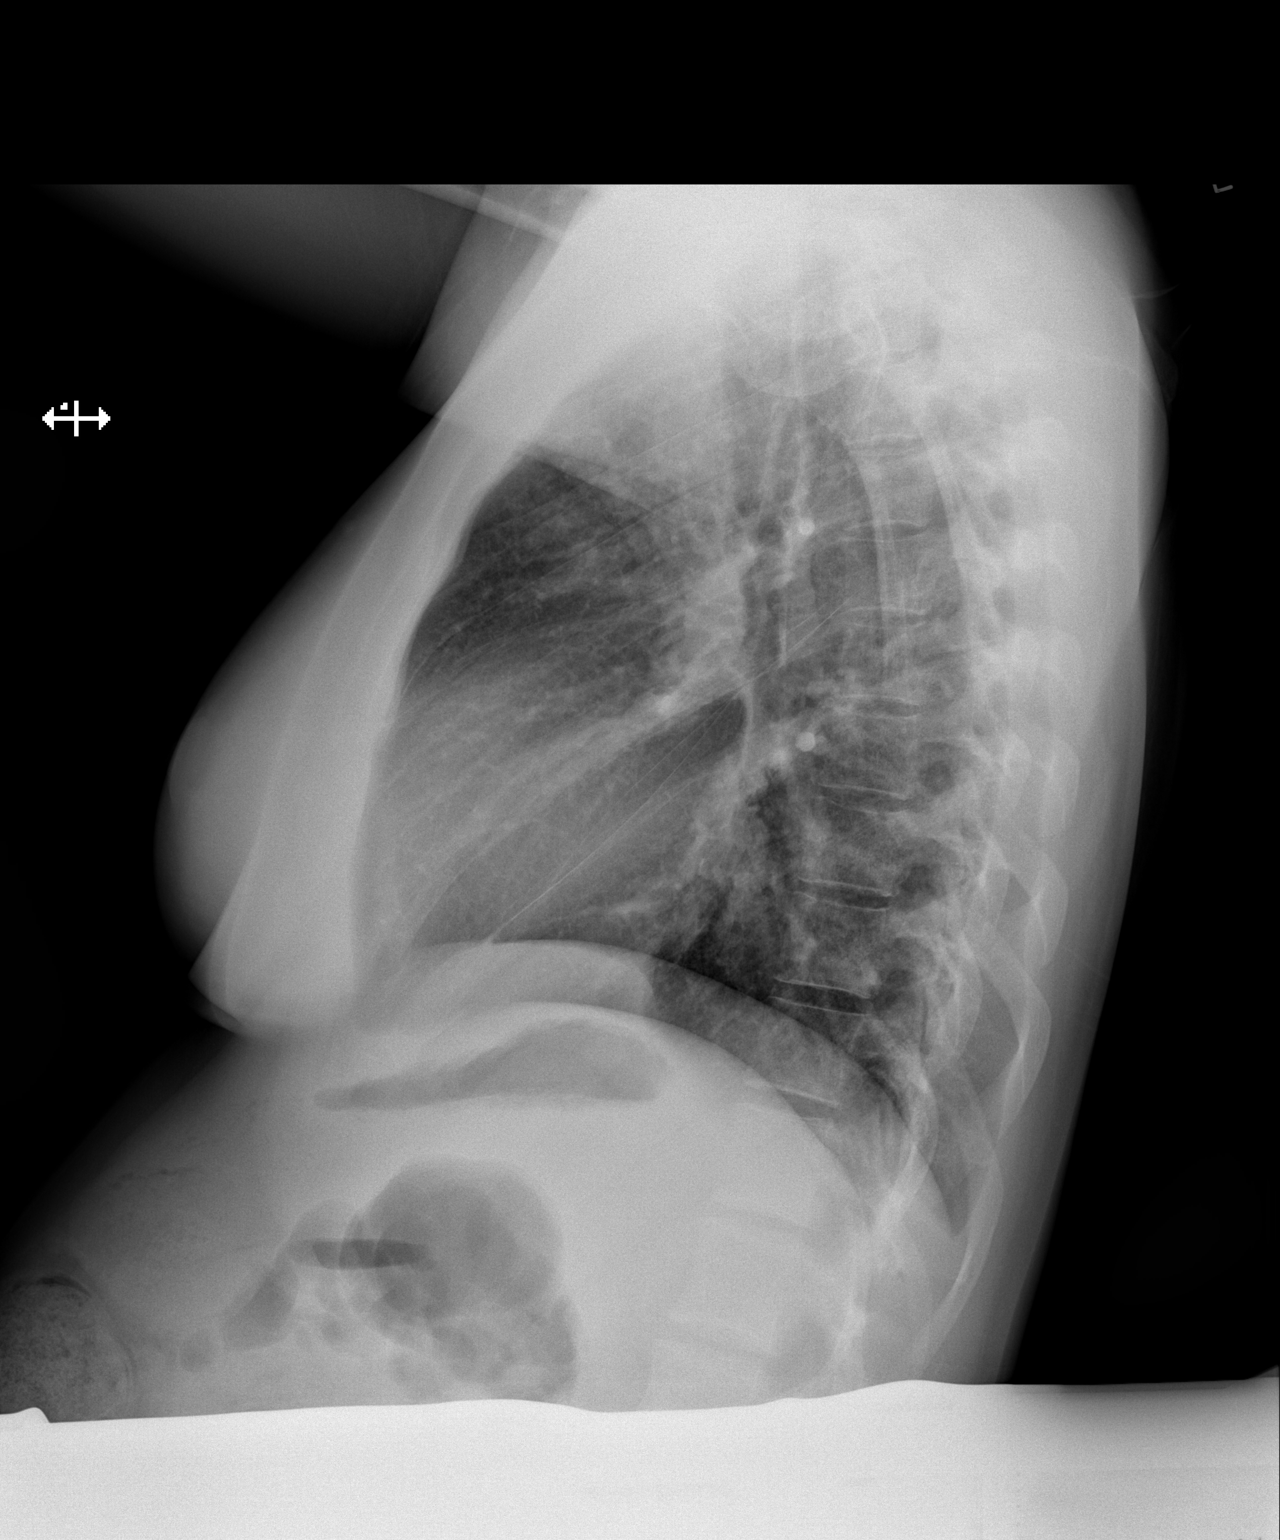

[2 of 2 positions shown; findings below may reference images not displayed]

FINDINGS: The cardiomediastinal silhouette is unremarkable.

Mild peribronchial thickening identified.

There is no evidence of focal airspace disease, pulmonary edema,
suspicious pulmonary nodule/mass, pleural effusion, or pneumothorax.
No acute bony abnormalities are identified.
IMPRESSION: Mild peribronchial thickening of uncertain chronicity, which may be
a manifestation of bronchitis or reactive/asthma changes. No other
significant abnormalities noted.
